# Patient Record
Sex: Female | Born: 2000 | Hispanic: Yes | Marital: Single | Smoking: Never smoker
Health system: Southern US, Community
[De-identification: ages and names within clinical notes are randomized; demographics above are authoritative.]

## PROBLEM LIST (undated history)

## (undated) DIAGNOSIS — Z789 Other specified health status: Secondary | ICD-10-CM

## (undated) HISTORY — PX: NO PAST SURGERIES: SHX2092

---
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
## 2022-06-28 ENCOUNTER — Encounter (HOSPITAL_COMMUNITY): Payer: Self-pay

## 2022-06-28 ENCOUNTER — Emergency Department (HOSPITAL_COMMUNITY)
Admission: EM | Admit: 2022-06-28 | Discharge: 2022-06-28 | Disposition: A | Discharge status: Home / Self Care | Payer: Self-pay | Attending: Emergency Medicine | Admitting: Emergency Medicine

## 2022-06-28 ENCOUNTER — Other Ambulatory Visit: Payer: Self-pay

## 2022-06-28 DIAGNOSIS — N939 Abnormal uterine and vaginal bleeding, unspecified: Secondary | ICD-10-CM | POA: Insufficient documentation

## 2022-06-28 LAB — CBC
HCT: 33.4 % — ABNORMAL LOW (ref 36.0–46.0)
Hemoglobin: 11.6 g/dL — ABNORMAL LOW (ref 12.0–15.0)
MCH: 30.4 pg (ref 26.0–34.0)
MCHC: 34.7 g/dL (ref 30.0–36.0)
MCV: 87.7 fL (ref 80.0–100.0)
Platelets: 292 10*3/uL (ref 150–400)
RBC: 3.81 MIL/uL — ABNORMAL LOW (ref 3.87–5.11)
RDW: 13.4 % (ref 11.5–15.5)
WBC: 9.5 10*3/uL (ref 4.0–10.5)
nRBC: 0 % (ref 0.0–0.2)

## 2022-06-28 LAB — I-STAT BETA HCG BLOOD, ED (MC, WL, AP ONLY): I-stat hCG, quantitative: <5 m[IU]/mL (ref <5)

## 2022-06-28 NOTE — ED Provider Notes (Signed)
[facility] EMERGENCY DEPARTMENT AT [facility] Provider Note   CSN: [id number] Arrival date & time: 06/28/22  1503     History  Chief Complaint  Patient presents with   Vaginal Bleeding    Kristi Olsen is a 22 y.o. female.  22 year old female presents today for concern of vaginal bleeding for about 1 month duration.  States she goes through about 15 pads per day.  However she denies any pain, lightheadedness, chest pain, shortness of breath. Denies any vaginal discharge, dysuria, or any other complaints.   The history is provided by the patient. No language interpreter was used.       Home Medications Prior to Admission medications   Medication Sig Start Date End Date Taking? Authorizing Provider  ibuprofen (ADVIL,MOTRIN) 600 MG tablet Take 1 tablet (600 mg total) by mouth every 6 (six) hours as needed. 08/04/16   Arabella Merles, CNM  Prenatal Vit-Fe Fumarate-FA (PRENATAL VITAMIN PO) Take by mouth.    [provider]      Allergies    Patient has no known allergies.    Review of Systems   Review of Systems  Constitutional:  Negative for chills and fever.  Gastrointestinal:  Negative for abdominal pain and nausea.  Genitourinary:  Positive for vaginal bleeding. Negative for dysuria, hematuria, pelvic pain and vaginal discharge.  All other systems reviewed and are negative.   Physical Exam Updated Vital Signs BP 109/67 (BP Location: Right Arm)   Pulse 63   Temp 98.3 F (36.8 C) (Oral)   Resp 15   LMP 01/01/2022   SpO2 100%  Physical Exam Vitals and nursing note reviewed.  Constitutional:      General: She is not in acute distress.    Appearance: Normal appearance. She is not ill-appearing.  HENT:     Head: Normocephalic and atraumatic.     Nose: Nose normal.  Eyes:     Conjunctiva/sclera: Conjunctivae normal.  Cardiovascular:     Rate and Rhythm: Normal rate and regular rhythm.  Pulmonary:     Effort: Pulmonary effort is normal.  No respiratory distress.  Abdominal:     General: There is no distension.     Palpations: Abdomen is soft.     Tenderness: There is no abdominal tenderness. There is no guarding.  Musculoskeletal:        General: No deformity. Normal range of motion.  Skin:    Findings: No rash.  Neurological:     General: No focal deficit present.     Mental Status: She is alert.     ED Results / Procedures / Treatments   Labs (all labs ordered are listed, but only abnormal results are displayed) Labs Reviewed  CBC - Abnormal; Notable for the following components:      Result Value   RBC 3.81 (*)    Hemoglobin 11.6 (*)    HCT 33.4 (*)    All other components within normal limits  I-STAT BETA HCG BLOOD, ED ([facility], [facility], [facility] ONLY)    EKG None  Radiology No results found.  Procedures Procedures    Medications Ordered in ED Medications - No data to display  ED Course/ Medical Decision Making/ A&P                             Medical Decision Making Amount and/or Complexity of Data Reviewed Labs: ordered.   22 year old female presents with vaginal bleeding that  has been ongoing for 1 month.  She describes this as heavy however does not have any associated lightheadedness, chest pain, shortness of breath. Workup obtained and shows hemoglobin of 11.6.  Most recent to compare is from 3 years ago and was 12.6.  Significant change.  She is also hemodynamically stable.  Not tachycardic.  Without lightheadedness.  Do not suspect acute blood loss anemia.  I did offer a pelvic exam which patient defers.  Likely abnormal uterine bleed.  She does have GYN follow-up.  Patient is stable for discharge.  Discharged in stable condition.  I did discuss a medication to help slow down the bleeding however given she is asymptomatic we had a shared decision making she defers on starting this medication.  She is appropriate for discharge.  Discharged in stable condition.  Return precautions discussed.  Final  Clinical Impression(s) / ED Diagnoses Final diagnoses:  Vaginal bleeding    Rx / DC Orders ED Discharge Orders     None         Marita [place], PA-C 06/28/22 1958    Terald Sleeper, MD 06/28/22 [phone]

## 2022-06-28 NOTE — ED Triage Notes (Signed)
Pt states she has been bleeding for 1 month from vagina. Last period was December 6th. Pt denies pregnancy. Axox4 axox4 pt changes pad every 2-3 hours

## 2022-06-28 NOTE — Discharge Instructions (Addendum)
Your exam today was reassuring.  No concerning cause of your symptoms.  Follow-up with your gynecologist.  If you are unable to have attached information for sensors for women's health care, that she can establish care with.  If you have worsening bleeding, you become lightheaded please return to the emergency room for evaluation.  Tu examen de hoy fue tranquilizador.  No hay ninguna causa preocupante de sus sntomas.  Seguimiento con tu gineclogo.  Si no puede tener adjunta informacin de sensores para el cuidado de la salud de la [place], con los que ella pueda Warden/ranger atencin.  Si su sangrado empeora o se siente mareado, regrese a la sala de emergencias para una evaluacin.

## 2022-06-28 NOTE — ED Notes (Signed)
Patient verbalizes understanding of discharge instructions. Opportunity for questioning and answers were provided. Armband removed by staff, pt discharged from ED. Pt ambulatory to ED waiting room with steady gait.  

## 2022-10-18 ENCOUNTER — Other Ambulatory Visit: Payer: Self-pay

## 2022-10-18 ENCOUNTER — Emergency Department (HOSPITAL_COMMUNITY)
Admission: EM | Admit: 2022-10-18 | Discharge: 2022-10-18 | Disposition: A | Discharge status: Home / Self Care | Payer: Self-pay | Attending: Emergency Medicine | Admitting: Emergency Medicine

## 2022-10-18 ENCOUNTER — Emergency Department (HOSPITAL_COMMUNITY): Payer: Self-pay

## 2022-10-18 DIAGNOSIS — N939 Abnormal uterine and vaginal bleeding, unspecified: Secondary | ICD-10-CM | POA: Insufficient documentation

## 2022-10-18 LAB — WET PREP, GENITAL
Clue Cells Wet Prep HPF POC: NONE SEEN
Sperm: NONE SEEN
Trich, Wet Prep: NONE SEEN
WBC, Wet Prep HPF POC: <10 (ref <10)
Yeast Wet Prep HPF POC: NONE SEEN

## 2022-10-18 LAB — CBC
HCT: 33.3 % — ABNORMAL LOW (ref 36.0–46.0)
Hemoglobin: 11.4 g/dL — ABNORMAL LOW (ref 12.0–15.0)
MCH: 28.7 pg (ref 26.0–34.0)
MCHC: 34.2 g/dL (ref 30.0–36.0)
MCV: 83.9 fL (ref 80.0–100.0)
Platelets: 283 10*3/uL (ref 150–400)
RBC: 3.97 MIL/uL (ref 3.87–5.11)
RDW: 14.2 % (ref 11.5–15.5)
WBC: 11 10*3/uL — ABNORMAL HIGH (ref 4.0–10.5)
nRBC: 0 % (ref 0.0–0.2)

## 2022-10-18 LAB — COMPREHENSIVE METABOLIC PANEL
ALT: 22 U/L (ref 0–44)
AST: 20 U/L (ref 15–41)
Albumin: 3.6 g/dL (ref 3.5–5.0)
Alkaline Phosphatase: 80 U/L (ref 38–126)
Anion gap: 11 (ref 5–15)
BUN: 12 mg/dL (ref 6–20)
CO2: 21 mmol/L — ABNORMAL LOW (ref 22–32)
Calcium: 8.7 mg/dL — ABNORMAL LOW (ref 8.9–10.3)
Chloride: 104 mmol/L (ref 98–111)
Creatinine, Ser: 0.75 mg/dL (ref 0.44–1.00)
GFR, Estimated: >60 mL/min (ref >60)
Glucose, Bld: 97 mg/dL (ref 70–99)
Potassium: 3.8 mmol/L (ref 3.5–5.1)
Sodium: 136 mmol/L (ref 135–145)
Total Bilirubin: 0.5 mg/dL (ref 0.3–1.2)
Total Protein: 6.5 g/dL (ref 6.5–8.1)

## 2022-10-18 LAB — HCG, QUANTITATIVE, PREGNANCY: hCG, Beta Chain, Quant, S: 1 m[IU]/mL (ref <5)

## 2022-10-18 NOTE — ED Provider Notes (Signed)
[place] EMERGENCY DEPARTMENT AT [facility] Provider Note   CSN: [id number] Arrival date & time: 10/18/22  4098     History {Add pertinent medical, surgical, social history, OB history to HPI:1} No chief complaint on file.   Andres Anum Carli is a 22 y.o. female with PMH as listed below who presents with vaginal bleeding x 4 months, worsening over the last few days. Worse than a period, reports large clots. Denies f/c, CP/SOB, dizziness/lightheadedness, vaginal discharge itching or burning, urinary sxs, diarrhea/constipation. Endorses 6/10 suprapubic abdominal pain as well that is intermittent. Also endorses some dyspnea. Bleeding through one pad per hour. Saw clinic on Wednesday and was given pills to take but they didn't help. G1P1001, child is 44 years old.  Has an outpatient ultrasound scheduled but not for a couple of weeks.  She was seen on 06/28/2022 in the ED as well for vaginal bleeding and had hemoglobin 11.6, patient deferred pelvic exam at that time.  She states that she used to take oral birth control pills but does not anymore.   Past Medical History:  Diagnosis Date   Medical history non-contributory        Home Medications Prior to Admission medications   Medication Sig Start Date End Date Taking? Authorizing Provider  ibuprofen (ADVIL,MOTRIN) 600 MG tablet Take 1 tablet (600 mg total) by mouth every 6 (six) hours as needed. 08/04/16   Arabella Merles, CNM  Prenatal Vit-Fe Fumarate-FA (PRENATAL VITAMIN PO) Take by mouth.    [provider]      Allergies    Patient has no known allergies.    Review of Systems   Review of Systems A 10 point review of systems was performed and is negative unless otherwise reported in HPI.  Physical Exam Updated Vital Signs BP 123/65   Pulse 83   Temp 98.5 F (36.9 C) (Oral)   Resp 16   Ht 5' (1.524 m)   Wt 70 kg   SpO2 100%   BMI 30.14 kg/m  Physical Exam General: Normal appearing female, lying in  bed.  HEENT: Sclera anicteric, MMM, trachea midline.  Cardiology: RRR Resp: Normal respiratory rate and effort.  Abd: Lower abdominal fullness and TTP. Soft, non-distended. No rebound tenderness or guarding.  Pelvic: Performed with RN chaperone. *** MSK: No peripheral edema or signs of trauma. Extremities without deformity or TTP. No cyanosis or clubbing. Skin: warm, dry. Neuro: A&Ox4, CNs II-XII grossly intact. MAEs. Sensation grossly intact.  Psych: Normal mood and affect.   ED Results / Procedures / Treatments   Labs (all labs ordered are listed, but only abnormal results are displayed) Labs Reviewed  WET PREP, GENITAL  CBC  HCG, QUANTITATIVE, PREGNANCY  COMPREHENSIVE METABOLIC PANEL  GC/CHLAMYDIA PROBE AMP () NOT AT [facility]    EKG None  Radiology No results found.  Procedures Pelvic exam  Date/Time: 10/18/2022 9:45 AM  Performed by: Loetta Rough, MD Authorized by: Loetta Rough, MD  Consent: Verbal consent obtained. Risks and benefits: risks, benefits and alternatives were discussed Consent given by: patient Patient identity confirmed: verbally with patient Time out: Immediately prior to procedure a "time out" was called to verify the correct patient, procedure, equipment, support staff and site/side marked as required. Local anesthesia used: no  Anesthesia: Local anesthesia used: no  Sedation: Patient sedated: no  Patient tolerance: patient tolerated the procedure well with no immediate complications     {Document cardiac monitor, telemetry assessment procedure when appropriate:1}  Medications Ordered in ED Medications - No data to display  ED Course/ Medical Decision Making/ A&P                          Medical Decision Making Amount and/or Complexity of Data Reviewed Labs: ordered.    This patient presents to the ED for concern of ***, this involves an extensive number of treatment options, and is a complaint that carries with it a  high risk of complications and morbidity.  I considered the following differential and admission for this acute, potentially life threatening condition.   MDM:    *** In nonpregnant females, vaginal bleeding consider:  Structural causes such as polyps, adenomyosis, leiomyomas, malignancy/hyperplasia Nonstructural causes such as coagulopathies, ovulatory dysfunction, endometrial bleeding, iatrogenic causes  Menorrhagia: >7 day (prolonged) or >80 mL/day (excessive) uterine bleeding at regular intervals  Metrorrhagia: irregular vaginal bleeding outside the normal cycle  Menometrorrhagia: excessive irregular vaginal bleeding      Labs: I Ordered, and personally interpreted labs.  The pertinent results include:  ***  Imaging Studies ordered: I ordered imaging studies including *** I independently visualized and interpreted imaging. I agree with the radiologist interpretation  Additional history obtained from ***.  External records from outside source obtained and reviewed including ***  Cardiac Monitoring: The patient was maintained on a cardiac monitor.  I personally viewed and interpreted the cardiac monitored which showed an underlying rhythm of: ***  Reevaluation: After the interventions noted above, I reevaluated the patient and found that they have :{resolved/improved/worsened:23923::"improved"}  Social Determinants of Health: ***  Disposition:  ***  Co morbidities that complicate the patient evaluation  Past Medical History:  Diagnosis Date   Medical history non-contributory      Medicines No orders of the defined types were placed in this encounter.   I have reviewed the patients home medicines and have made adjustments as needed  Problem List / ED Course: Problem List Items Addressed This Visit   None        {Document critical care time when appropriate:1} {Document review of labs and clinical decision tools ie heart score, Chads2Vasc2 etc:1}   {Document your independent review of radiology images, and any outside records:1} {Document your discussion with family members, caretakers, and with consultants:1} {Document social determinants of health affecting pt's care:1} {Document your decision making why or why not admission, treatments were needed:1}  This note was created using dictation software, which may contain spelling or grammatical errors.

## 2022-10-18 NOTE — Discharge Instructions (Addendum)
Thank you for coming to [facility] Emergency Department. You were seen for vaginal bleeding. We did an exam, labs, and imaging, and these showed abnormal uterine bleeding.  You had an ultrasound that was normal.  The results are listed below.  Please call your primary care physician or your OB/GYN to make a follow-up appointment within the next 1 to 2 weeks.  You can discuss other options for management.   Gracias por venir al [facility]. La atendieron por sangrado vaginal. Hicimos un examen, laboratorios e imgenes, y mostraron sangrado uterino anormal.  Te hicieron una ecografa que fue normal.  Los resultados se enumeran a continuacin.  Llame a su mdico de atencin primaria o a su obstetra/gineclogo para programar una cita de seguimiento dentro de las prximas 1 a 2 semanas.  Puede discutir otras opciones de gestin.  Do not hesitate to return to the ED or call 911 if you experience: -Worsening symptoms -Chest pain, shortness of breath -Saturating >2 pads per hour for >2 hours -Lightheadedness, passing out -Fevers/chills -Anything else that concerns you  No dude en regresar al servicio de urgencias o llamar al 911 si experimenta: -Empeoramiento de los sntomas -Dolor en el pecho, dificultad para respirar. -Saturar >2 toallas sanitarias por hora durante >2 horas -Aturdimiento, desmayo. -Fiebre/escalofros -Cualquier otra cosa que te preocupe   Transvaginal non-OB US: FINDINGS:  Uterus    Measurements: 7.8 x 3.7 x 4.5 cm = volume: 67 mL. No fibroids or  other mass visualized.    Endometrium    Thickness: 12 mm.  No focal abnormality visualized.    Right ovary    Measurements: 3.7 x 2.3 x 2.7 cm = volume: 12.1 mL. Normal  appearance/no adnexal mass.    Left ovary    Measurements: 3.3 x 1.6 x 1.7 cm = volume: 4.6 mL. Normal  appearance/no adnexal mass.    Pulsed Doppler evaluation of both ovaries demonstrates normal  low-resistance arterial  and venous waveforms.    Other findings    No abnormal free fluid.    IMPRESSION:  No sonographic etiology for pelvic pain identified.

## 2022-10-18 NOTE — ED Triage Notes (Signed)
Pt arrives POV with complaints of vaginal bleeding for 2 months and says that she is changing a pad every hour. Pt also has lower abdominal pain with the bleeding.

## 2022-10-20 LAB — GC/CHLAMYDIA PROBE AMP (~~LOC~~) NOT AT ARMC
Chlamydia: NEGATIVE
Comment: NEGATIVE
Comment: NORMAL
Neisseria Gonorrhea: NEGATIVE
